# Patient Record
Sex: Male | Born: 1955 | Race: White | Hispanic: No | Marital: Married | Smoking: Never smoker
Health system: Southern US, Community
[De-identification: ages and names within clinical notes are randomized; demographics above are authoritative.]

## PROBLEM LIST (undated history)

## (undated) DIAGNOSIS — S83209A Unspecified tear of unspecified meniscus, current injury, unspecified knee, initial encounter: Secondary | ICD-10-CM

## (undated) HISTORY — PX: MOUTH SURGERY: SHX715

---

## 2014-11-27 ENCOUNTER — Other Ambulatory Visit: Payer: Self-pay | Admitting: Orthopedic Surgery

## 2014-11-27 DIAGNOSIS — M2392 Unspecified internal derangement of left knee: Secondary | ICD-10-CM

## 2014-11-27 DIAGNOSIS — M25562 Pain in left knee: Secondary | ICD-10-CM

## 2014-11-30 ENCOUNTER — Ambulatory Visit
Admission: RE | Admit: 2014-11-30 | Discharge: 2014-11-30 | Disposition: A | Discharge status: Home / Self Care | Payer: BLUE CROSS/BLUE SHIELD | Source: Ambulatory Visit | Attending: Orthopedic Surgery | Admitting: Orthopedic Surgery

## 2014-11-30 DIAGNOSIS — M7122 Synovial cyst of popliteal space [Baker], left knee: Secondary | ICD-10-CM | POA: Diagnosis not present

## 2014-11-30 DIAGNOSIS — M2392 Unspecified internal derangement of left knee: Secondary | ICD-10-CM

## 2014-11-30 DIAGNOSIS — M25562 Pain in left knee: Secondary | ICD-10-CM | POA: Diagnosis present

## 2014-11-30 DIAGNOSIS — X58XXXA Exposure to other specified factors, initial encounter: Secondary | ICD-10-CM | POA: Diagnosis not present

## 2014-11-30 DIAGNOSIS — M9420 Chondromalacia, unspecified site: Secondary | ICD-10-CM | POA: Diagnosis not present

## 2014-11-30 DIAGNOSIS — S83242A Other tear of medial meniscus, current injury, left knee, initial encounter: Secondary | ICD-10-CM | POA: Diagnosis not present

## 2014-12-03 ENCOUNTER — Encounter: Payer: Self-pay | Admitting: *Deleted

## 2014-12-03 ENCOUNTER — Inpatient Hospital Stay: Admission: RE | Admit: 2014-12-03 | Payer: BLUE CROSS/BLUE SHIELD | Source: Ambulatory Visit

## 2014-12-04 ENCOUNTER — Encounter: Payer: Self-pay | Admitting: *Deleted

## 2014-12-04 NOTE — Patient Instructions (Signed)
  Your procedure is scheduled on: 12-12-14 Report to [facility] To find out your arrival time please call [phone] between 1PM - 3PM on 12-11-14  Remember: Instructions that are not followed completely may result in serious medical risk, up to and including death, or upon the discretion of your surgeon and anesthesiologist your surgery may need to be rescheduled.    _X___ 1. Do not eat food or drink liquids after midnight. No gum chewing or hard candies.     _X___ 2. No Alcohol for 24 hours before or after surgery.   ____ 3. Bring all medications with you on the day of surgery if instructed.    ____ 4. Notify your doctor if there is any change in your medical condition     (cold, fever, infections).     Do not wear jewelry, make-up, hairpins, clips or nail polish.  Do not wear lotions, powders, or perfumes. You may wear deodorant.  Do not shave 48 hours prior to surgery. Men may shave face and neck.  Do not bring valuables to the hospital.    [facility] is not responsible for any belongings or valuables.               Contacts, dentures or bridgework may not be worn into surgery.  Leave your suitcase in the car. After surgery it may be brought to your room.  For patients admitted to the hospital, discharge time is determined by your treatment team.   Patients discharged the day of surgery will not be allowed to drive home.   Please read over the following fact sheets that you were given:      ____ Take these medicines the morning of surgery with A SIP OF WATER:    1. NONE  2.   3.   4.  5.  6.  ____ Fleet Enema (as directed)   ____ Use CHG Soap as directed  ____ Use inhalers on the day of surgery  ____ Stop metformin 2 days prior to surgery    ____ Take 1/2 of usual insulin dose the night before surgery and none on the morning of surgery.   ____ Stop Coumadin/Plavix/aspirin-N/A  _X___ Stop Anti-inflammatories-STOP  IBUPROFEN/MELOXICAM 7 DAYS PRIOR   ____ Stop supplements until after surgery.    ____ Bring C-Pap to the hospital.

## 2014-12-12 ENCOUNTER — Encounter: Payer: Self-pay | Admitting: *Deleted

## 2014-12-12 ENCOUNTER — Encounter
Admission: RE | Disposition: A | Discharge status: Home / Self Care | Payer: Self-pay | Source: Ambulatory Visit | Attending: Orthopedic Surgery

## 2014-12-12 ENCOUNTER — Ambulatory Visit
Admission: RE | Admit: 2014-12-12 | Discharge: 2014-12-12 | Disposition: A | Discharge status: Home / Self Care | Payer: BLUE CROSS/BLUE SHIELD | Source: Ambulatory Visit | Attending: Orthopedic Surgery | Admitting: Orthopedic Surgery

## 2014-12-12 ENCOUNTER — Ambulatory Visit: Payer: BLUE CROSS/BLUE SHIELD | Admitting: Anesthesiology

## 2014-12-12 DIAGNOSIS — M23222 Derangement of posterior horn of medial meniscus due to old tear or injury, left knee: Secondary | ICD-10-CM | POA: Diagnosis not present

## 2014-12-12 DIAGNOSIS — M94262 Chondromalacia, left knee: Secondary | ICD-10-CM | POA: Diagnosis present

## 2014-12-12 DIAGNOSIS — Z79899 Other long term (current) drug therapy: Secondary | ICD-10-CM | POA: Insufficient documentation

## 2014-12-12 HISTORY — PX: KNEE ARTHROSCOPY: SHX127

## 2014-12-12 HISTORY — DX: Unspecified tear of unspecified meniscus, current injury, unspecified knee, initial encounter: S83.209A

## 2014-12-12 SURGERY — ARTHROSCOPY, KNEE
Anesthesia: General | Laterality: Left

## 2014-12-12 MED ORDER — BUPIVACAINE-EPINEPHRINE (PF) 0.25% -1:200000 IJ SOLN
INTRAMUSCULAR | Status: AC
  Filled 2014-12-12: qty 30

## 2014-12-12 MED ORDER — PROPOFOL 10 MG/ML IV BOLUS
INTRAVENOUS | Status: DC | PRN
  Administered 2014-12-12: 170 mg via INTRAVENOUS
  Administered 2014-12-12: 30 mg via INTRAVENOUS

## 2014-12-12 MED ORDER — FENTANYL CITRATE (PF) 100 MCG/2ML IJ SOLN
25.0000 ug | INTRAMUSCULAR | Status: DC | PRN
  Administered 2014-12-12: 25 ug via INTRAVENOUS

## 2014-12-12 MED ORDER — FAMOTIDINE 20 MG PO TABS
ORAL_TABLET | ORAL | Status: AC
  Administered 2014-12-12: 20 mg via ORAL
  Filled 2014-12-12: qty 1

## 2014-12-12 MED ORDER — FENTANYL CITRATE (PF) 100 MCG/2ML IJ SOLN
INTRAMUSCULAR | Status: DC | PRN
  Administered 2014-12-12 (×8): 25 ug via INTRAVENOUS

## 2014-12-12 MED ORDER — MORPHINE SULFATE (PF) 4 MG/ML IV SOLN
INTRAVENOUS | Status: AC
  Filled 2014-12-12: qty 1

## 2014-12-12 MED ORDER — ONDANSETRON HCL 4 MG/2ML IJ SOLN: 4.0000 mg | Freq: Once | INTRAMUSCULAR | Status: DC | PRN

## 2014-12-12 MED ORDER — FAMOTIDINE 20 MG PO TABS
20.0000 mg | ORAL_TABLET | Freq: Once | ORAL | Status: AC
  Administered 2014-12-12: 20 mg via ORAL

## 2014-12-12 MED ORDER — HYDROCODONE-ACETAMINOPHEN 5-325 MG PO TABS: 1.0000 | ORAL_TABLET | ORAL | Status: AC | PRN

## 2014-12-12 MED ORDER — BUPIVACAINE-EPINEPHRINE (PF) 0.25% -1:200000 IJ SOLN
INTRAMUSCULAR | Status: DC | PRN
  Administered 2014-12-12: 30 mL

## 2014-12-12 MED ORDER — LIDOCAINE HCL (CARDIAC) 20 MG/ML IV SOLN
INTRAVENOUS | Status: DC | PRN
  Administered 2014-12-12: 60 mg via INTRAVENOUS

## 2014-12-12 MED ORDER — CHLORHEXIDINE GLUCONATE 4 % EX LIQD: Freq: Once | CUTANEOUS | Status: DC

## 2014-12-12 MED ORDER — ACETAMINOPHEN 10 MG/ML IV SOLN
INTRAVENOUS | Status: DC | PRN
  Administered 2014-12-12: 1000 mg via INTRAVENOUS

## 2014-12-12 MED ORDER — MIDAZOLAM HCL 5 MG/5ML IJ SOLN
INTRAMUSCULAR | Status: DC | PRN
  Administered 2014-12-12: 2 mg via INTRAVENOUS

## 2014-12-12 MED ORDER — FENTANYL CITRATE (PF) 100 MCG/2ML IJ SOLN
INTRAMUSCULAR | Status: AC
  Administered 2014-12-12: 25 ug via INTRAVENOUS
  Filled 2014-12-12: qty 2

## 2014-12-12 MED ORDER — ACETAMINOPHEN 10 MG/ML IV SOLN
INTRAVENOUS | Status: AC
  Filled 2014-12-12: qty 100

## 2014-12-12 MED ORDER — LACTATED RINGERS IV SOLN
INTRAVENOUS | Status: DC
Start: 2014-12-12 — End: 2014-12-12
  Administered 2014-12-12 (×2): via INTRAVENOUS

## 2014-12-12 MED ORDER — DEXAMETHASONE SODIUM PHOSPHATE 10 MG/ML IJ SOLN
INTRAMUSCULAR | Status: DC | PRN
  Administered 2014-12-12: 5 mg via INTRAVENOUS

## 2014-12-12 MED ORDER — EPHEDRINE SULFATE 50 MG/ML IJ SOLN
INTRAMUSCULAR | Status: DC | PRN
  Administered 2014-12-12: 5 mg via INTRAVENOUS

## 2014-12-12 SURGICAL SUPPLY — 23 items
BLADE SHAVER 4.5 DBL SERAT CV (CUTTER) ×3 IMPLANT
BNDG ESMARK 6X12 TAN STRL LF (GAUZE/BANDAGES/DRESSINGS) ×3 IMPLANT
DRSG DERMACEA 8X12 NADH (GAUZE/BANDAGES/DRESSINGS) ×3 IMPLANT
DURAPREP 26ML APPLICATOR (WOUND CARE) ×6 IMPLANT
GAUZE SPONGE 4X4 12PLY STRL (GAUZE/BANDAGES/DRESSINGS) ×3 IMPLANT
GLOVE BIOGEL M STRL SZ7.5 (GLOVE) ×3 IMPLANT
GLOVE INDICATOR 8.0 STRL GRN (GLOVE) ×3 IMPLANT
GOWN STRL REUS W/ TWL LRG LVL3 (GOWN DISPOSABLE) ×1 IMPLANT
GOWN STRL REUS W/ TWL LRG LVL4 (GOWN DISPOSABLE) ×1 IMPLANT
GOWN STRL REUS W/TWL LRG LVL3 (GOWN DISPOSABLE) ×2
GOWN STRL REUS W/TWL LRG LVL4 (GOWN DISPOSABLE) ×2
IV LACTATED RINGER IRRG 3000ML (IV SOLUTION) ×12
IV LR IRRIG 3000ML ARTHROMATIC (IV SOLUTION) ×6 IMPLANT
MANIFOLD NEPTUNE II (INSTRUMENTS) ×3 IMPLANT
PACK ARTHROSCOPY KNEE (MISCELLANEOUS) ×3 IMPLANT
SET TUBE SUCT SHAVER OUTFL 24K (TUBING) ×3 IMPLANT
SET TUBE TIP INTRA-ARTICULAR (MISCELLANEOUS) ×3 IMPLANT
STRAP SAFETY BODY (MISCELLANEOUS) ×3 IMPLANT
SUT ETHILON 3-0 FS-10 30 BLK (SUTURE) ×3
SUTURE EHLN 3-0 FS-10 30 BLK (SUTURE) ×1 IMPLANT
TUBING ARTHRO INFLOW-ONLY STRL (TUBING) ×3 IMPLANT
WAND HAND CNTRL MULTIVAC 50 (MISCELLANEOUS) ×3 IMPLANT
WRAP KNEE W/COLD PACKS 25.5X14 (SOFTGOODS) ×3 IMPLANT

## 2014-12-12 NOTE — H&P (Signed)
The patient has been re-examined, and the chart reviewed, and there have been no interval changes to the documented history and physical.    The risks, benefits, and alternatives have been discussed at length. The patient expressed understanding of the risks benefits and agreed with plans for surgical intervention.  Savalas Monje P. Breean Nannini, Jr. M.D.    

## 2014-12-12 NOTE — Anesthesia Postprocedure Evaluation (Signed)
  Anesthesia Post-op Note  Patient: Roberto Mathews  Procedure(s) Performed: Procedure(s): Right knee arthroscopy, partial medial menisectomy (Left)  Anesthesia type:General  Patient location: PACU  Post pain: Pain level controlled  Post assessment: Post-op Vital signs reviewed, Patient's Cardiovascular Status Stable, Respiratory Function Stable, Patent Airway and No signs of Nausea or vomiting  Post vital signs: Reviewed and stable  Last Vitals:  Filed Vitals:   12/12/14 1907  BP:   Pulse:   Temp: 36.6 C  Resp:     Level of consciousness: awake, alert  and patient cooperative  Complications: No apparent anesthesia complications

## 2014-12-12 NOTE — Op Note (Signed)
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   ASSISTANT: none  ANESTHESIA: general  ESTIMATED BLOOD LOSS: Minimal  FLUIDS REPLACED: 1000 mL of crystalloid  TOURNIQUET TIME: Not used   DRAINS: none  IMPLANTS UTILIZED: None  INDICATIONS FOR SURGERY: Roberto Mathews is a 59 y.o. year old male who has been seen for complaints of left knee pain. MRI demonstrated findings consistent with meniscal pathology. After discussion of the risks and benefits of surgical intervention, the patient expressed understanding of the risks benefits and agree with plans for left knee arthroscopy.   PROCEDURE IN DETAIL: The patient was brought into the operating room and, after adequate general anesthesia was achieved, a tourniquet was applied to the left thigh and the leg was placed in the leg holder. All bony prominences were well padded. The patient's left knee was cleaned and prepped with alcohol and Duraprep and draped in the usual sterile fashion. A "timeout" was performed as per usual protocol. The anticipated portal sites were injected with 0.25% Marcaine with epinephrine. An anterolateral incision was made and a cannula was inserted. A small effusion was evacuated and the knee was distended with fluid using the pump. The scope was advanced down the medial gutter into the medial compartment. Under visualization with the scope, an anteromedial portal was created and a hooked probe was inserted. The medial meniscus was visualized and probed. There was a complex tear of the  posterior horn of the medial meniscus. The tear was debrided using meniscal punches and a 4.5 mm shaver. Final contouring was performed using the 50 ArthroCare wand. The articular cartilage was visualized. Several free floating chondral fragments were noted and removed using the incisor shaver. There was an area of grade 3 chondromalacia involving the medial femoral condyle. This area was debrided and contoured using the ArthroCare wand.  The scope was then advanced into the intercondylar notch. The anterior cruciate ligament was visualized and probed and felt to be intact. The scope was removed from the lateral portal and reinserted via the anteromedial portal to better visualize the lateral compartment. The lateral meniscus was visualized and probed. The lateral meniscus was stable without evidence of tear. The articular cartilage of the lateral compartment was visualized. The articular cartilage was in good condition. Finally, the scope was advanced so as to visualize the patellofemoral articulation. Good patellar tracking was appreciated. There was an area of grade 3 chondromalacia involving the trochlear groove. This tear was debrided and contoured using the ArthroCare wand.  The knee was irrigated with copius amounts of fluid and suctioned dry. The anterolateral portal was re-approximated with #3-0 nylon. A combination of 0.25% Marcaine with epinephrine and 4 mg of Morphine were injected via the scope. The scope was removed and the anteromedial portal was re-approximated with #3-0 nylon. A sterile dressing was applied followed by application of an ice wrap.  The patient tolerated the procedure well and was transported to the PACU in stable condition.  James P. Holley Bouche., M.D.

## 2014-12-12 NOTE — Brief Op Note (Signed)
12/12/2014  6:31 PM  PATIENT:  Bartholome Bill  59 y.o. male  PRE-OPERATIVE DIAGNOSIS:  INTERNAL DERANGEMENT left knee  POST-OPERATIVE DIAGNOSIS:  medial meniscal tear, grade 3-4 chondromalacia - left knee  PROCEDURE:  Procedure(s): Right knee arthroscopy, partial medial menisectomy (Left), chondroplasty  SURGEON:  Surgeon(s) and Role:    * Dereck Leep, MD - Primary  ASSISTANTS: none   ANESTHESIA:   general  EBL:  Total I/O In: 1000 [I.V.:1000] Out: - min  BLOOD ADMINISTERED:none  DRAINS: none   LOCAL MEDICATIONS USED:  MARCAINE     SPECIMEN:  No Specimen  DISPOSITION OF SPECIMEN:  N/A  COUNTS:  YES  TOURNIQUET: Not used  DICTATION: .Dragon Dictation  PLAN OF CARE: Discharge to home after PACU  PATIENT DISPOSITION:  PACU - hemodynamically stable.   Delay start of Pharmacological VTE agent (>24hrs) due to surgical blood loss or risk of bleeding: not applicable

## 2014-12-12 NOTE — Anesthesia Preprocedure Evaluation (Signed)
Anesthesia Evaluation  Patient identified by MRN, date of birth, ID band Patient awake    Reviewed: Allergy & Precautions, NPO status , Patient's Chart, lab work & pertinent test results  History of Anesthesia Complications Negative for: history of anesthetic complications  Airway Mallampati: I       Dental  (+) Implants   Pulmonary neg pulmonary ROS,           Cardiovascular negative cardio ROS       Neuro/Psych    GI/Hepatic negative GI ROS, Neg liver ROS,   Endo/Other  negative endocrine ROS  Renal/GU negative Renal ROS     Musculoskeletal   Abdominal   Peds  Hematology negative hematology ROS (+)   Anesthesia Other Findings   Reproductive/Obstetrics                             Anesthesia Physical Anesthesia Plan  ASA: II  Anesthesia Plan: General   Post-op Pain Management:    Induction: Intravenous  Airway Management Planned: LMA  Additional Equipment:   Intra-op Plan:   Post-operative Plan:   Informed Consent: I have reviewed the patients History and Physical, chart, labs and discussed the procedure including the risks, benefits and alternatives for the proposed anesthesia with the patient or authorized representative who has indicated his/her understanding and acceptance.     Plan Discussed with:   Anesthesia Plan Comments:         Anesthesia Quick Evaluation

## 2014-12-12 NOTE — Anesthesia Procedure Notes (Signed)
Procedure Name: LMA Insertion Date/Time: 12/12/2014 4:53 PM Performed by: Dionne Bucy Pre-anesthesia Checklist: Patient identified, Patient being monitored, Timeout performed, Emergency Drugs available and Suction available Patient Re-evaluated:Patient Re-evaluated prior to inductionOxygen Delivery Method: Circle system utilized Preoxygenation: Pre-oxygenation with 100% oxygen Intubation Type: IV induction Ventilation: Mask ventilation without difficulty LMA: LMA inserted LMA Size: 5.0 Tube type: Oral Number of attempts: 1 Placement Confirmation: positive ETCO2 Tube secured with: Tape Dental Injury: Teeth and Oropharynx as per pre-operative assessment

## 2014-12-12 NOTE — Transfer of Care (Signed)
Immediate Anesthesia Transfer of Care Note  Patient: Roberto Mathews  Procedure(s) Performed: Procedure(s): Right knee arthroscopy, partial medial menisectomy (Left)  Patient Location: PACU  Anesthesia Type:General  Level of Consciousness: awake, alert , oriented and patient cooperative  Airway & Oxygen Therapy: Patient Spontanous Breathing and Patient connected to face mask oxygen  Post-op Assessment: Report given to RN and Post -op Vital signs reviewed and stable  Post vital signs: Reviewed and stable  Last Vitals:  Filed Vitals:   12/12/14 1327  BP: 172/98  Pulse: 83  Temp: 36.8 C  Resp: 16    Complications: No apparent anesthesia complications

## 2014-12-12 NOTE — Discharge Instructions (Signed)
°  Instructions after Knee Arthroscopy  ° ° Arther Heisler P. Adelis Docter, Jr., M.D.    ° Dept. of Orthopaedics & Sports Medicine ° [facility] ° [address] ° [place], [place]  [address] ° ° Phone: [phone]   Fax: [phone] ° ° °DIET: °• Drink plenty of non-alcoholic fluids & begin a light diet. °• Resume your normal diet the day after surgery. ° °ACTIVITY:  °• You may use crutches or a walker with weight-bearing as tolerated, unless instructed otherwise. °• You may wean yourself off of the walker or crutches as tolerated.  °• Begin doing gentle exercises. Exercising will reduce the pain and swelling, increase motion, and prevent muscle weakness.   °• Avoid strenuous activities or athletics for a minimum of 4-6 weeks after arthroscopic surgery. °• Do not drive or operate any equipment until instructed. ° °WOUND CARE:  °• Place one to two pillows under the knee the first day or two when sitting or lying.  °• Continue to use the ice packs periodically to reduce pain and swelling. °• The small incisions in your knee are closed with nylon stitches. The stitches will be removed in the office. °• The bulky dressing may be removed on the second day after surgery. DO NOT TOUCH THE STITCHES. Put a Band-Aid over each stitch. Do NOT use any ointments or creams on the incisions.  °• You may bathe or shower after the stitches are removed at the first office visit following surgery. ° °MEDICATIONS: °• You may resume your regular medications. °• Please take the pain medication as prescribed. °• Do not take pain medication on an empty stomach. °• Do not drive or drink alcoholic beverages when taking pain medications. ° °CALL THE OFFICE FOR: °• Temperature above 101 degrees °• Excessive bleeding or drainage on the dressing. °• Excessive swelling, coldness, or paleness of the toes. °• Persistent nausea and vomiting. ° °FOLLOW-UP:  °• You should have an appointment to return to the office in 7-10 days after surgery.  °  °

## 2014-12-13 ENCOUNTER — Encounter: Payer: Self-pay | Admitting: Orthopedic Surgery

## 2014-12-24 NOTE — Addendum Note (Signed)
Addendum  created 12/24/14 1122 by Dionne Bucy, CRNA   Modules edited: Anesthesia Responsible Staff

## 2016-12-05 IMAGING — MR MR KNEE*L* W/O CM
5 series · 34 of 40 positions shown · non-contrast
Comparison: None.

CLINICAL DATA: Left knee pain and swelling for 2 months.

EXAM:
MRI OF THE LEFT KNEE WITHOUT CONTRAST
TECHNIQUE: Multiplanar, multisequence MR imaging of the knee was performed. No
intravenous contrast was administered.

[Series 5: PD fat-sat · axial · 3.0mm · 0.50mm/px · z∈[-66,+50]mm · 8 of 36 slices shown (1 of 3)]
[im 1/36]
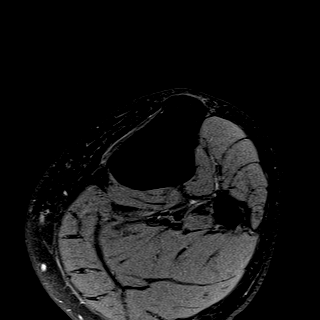
[im 6/36]
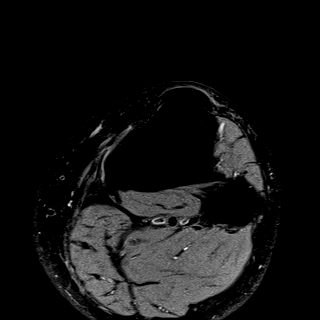
[im 11/36]
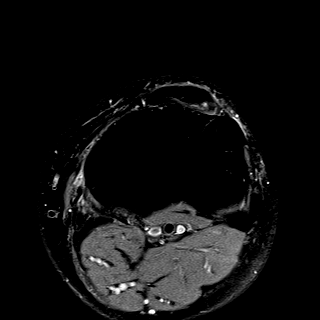
[im 16/36]
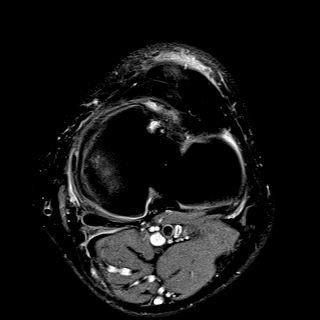
[im 21/36]
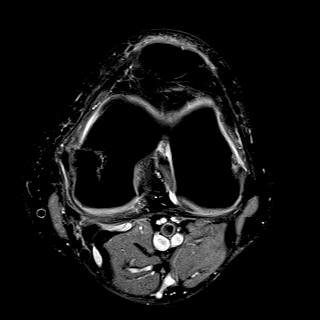
[im 26/36]
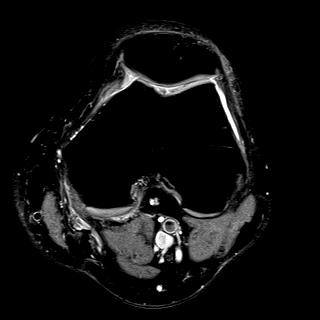
[im 31/36]
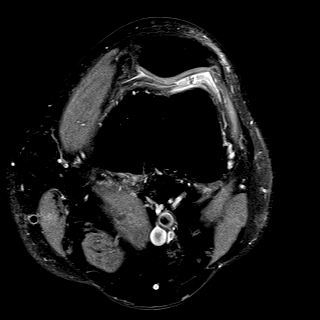
[im 36/36]
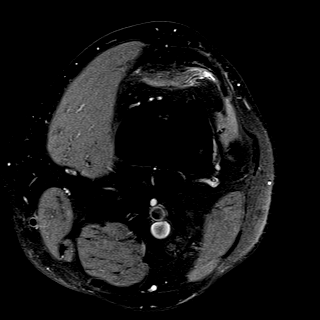

[Series 6: T1 · coronal · 3.0mm · 0.50mm/px · 2 of 33 slices shown]
[im 1/33]
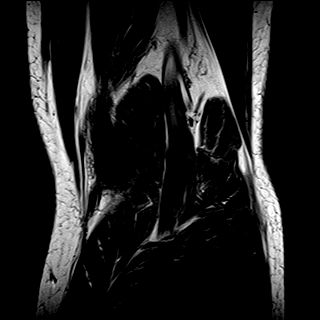
[im 5/33]
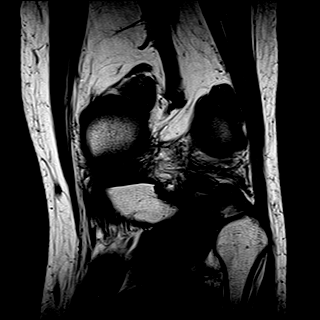

[Series 7: T2 fat-sat · coronal · 3.0mm · 0.31mm/px · 8 of 33 slices shown]
[im 1/33]
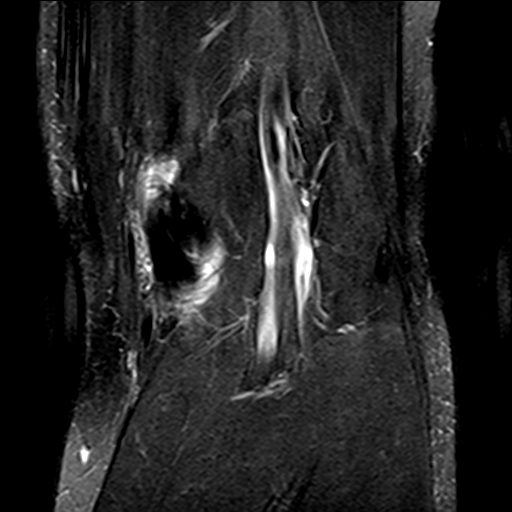
[im 5/33]
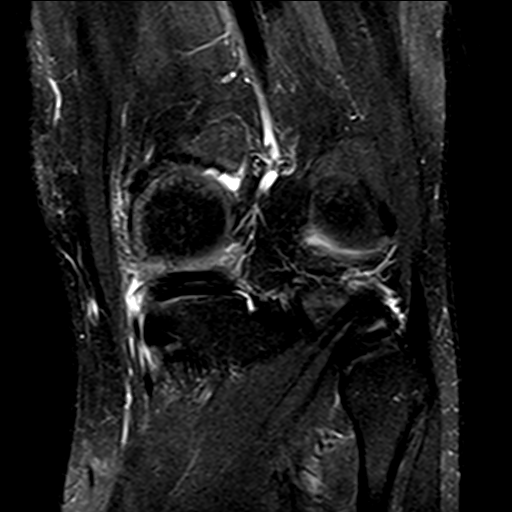
[im 10/33]
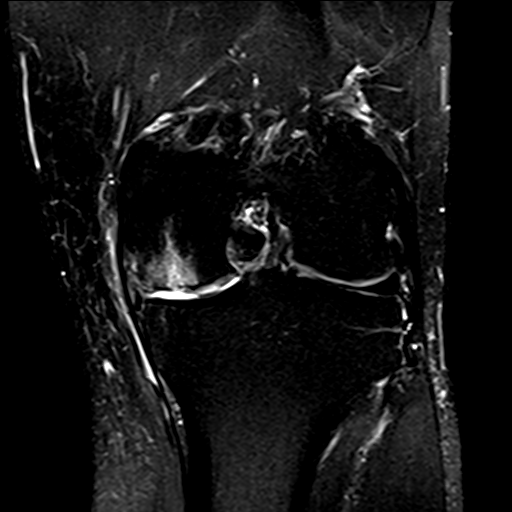
[im 14/33]
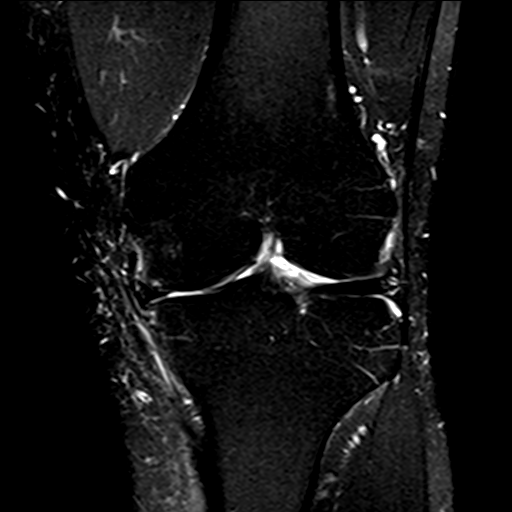
[im 19/33]
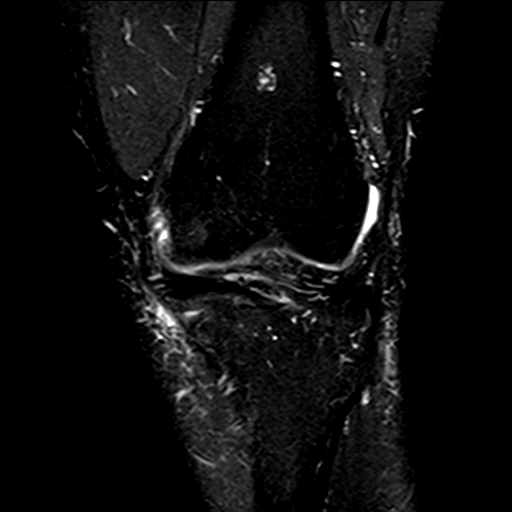
[im 23/33]
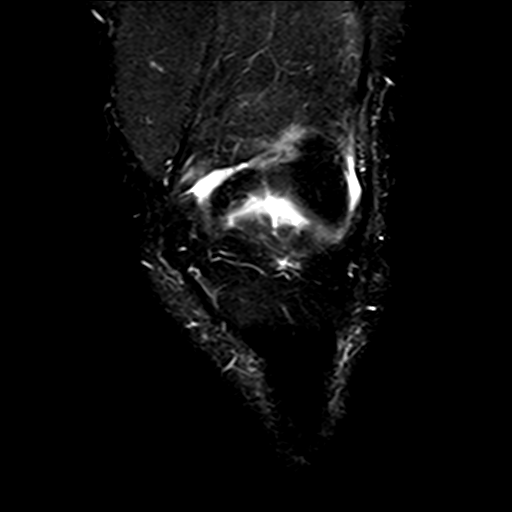
[im 28/33]
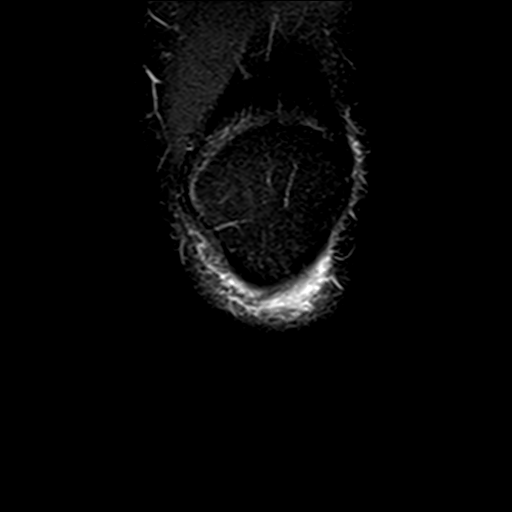
[im 33/33]
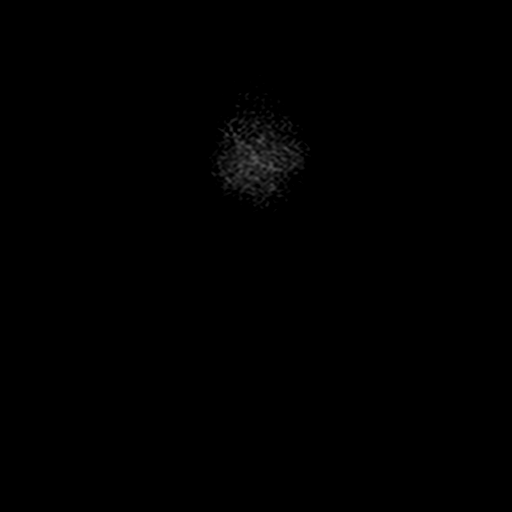

[Series 8: PD fat-sat · sagittal · 3.0mm · 0.50mm/px · 8 of 36 slices shown (2 of 3)]
[im 1/36]
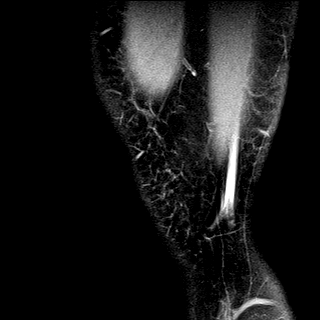
[im 6/36]
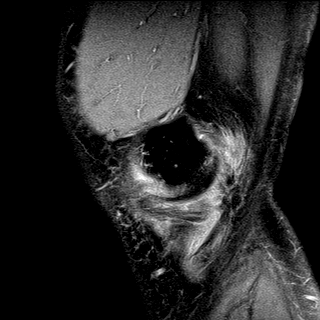
[im 11/36]
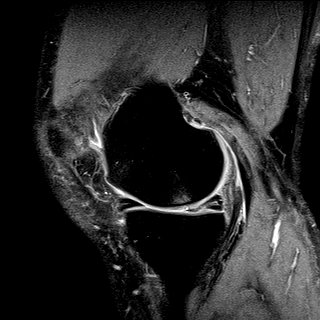
[im 16/36]
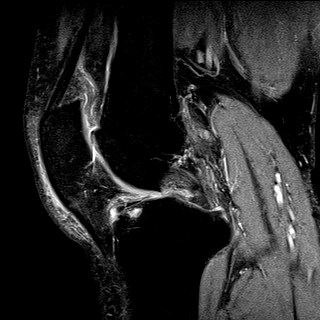
[im 21/36]
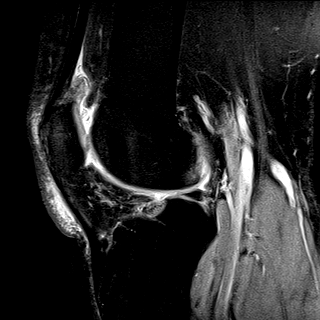
[im 26/36]
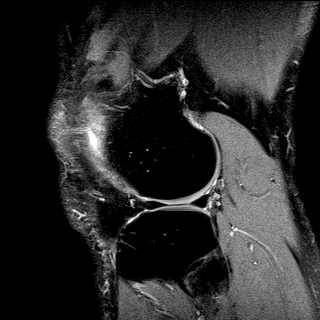
[im 31/36]
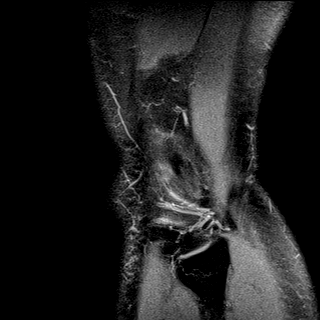
[im 36/36]
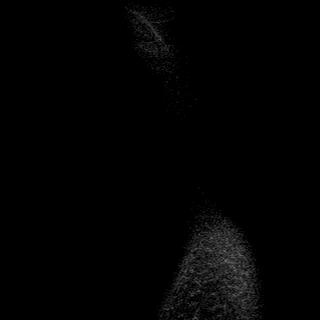

[Series 9: PD fat-sat · coronal · 3.0mm · 0.50mm/px · 8 of 33 slices shown (3 of 3)]
[im 1/33]
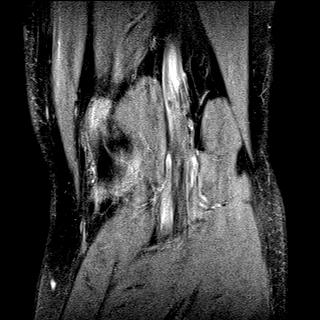
[im 5/33]
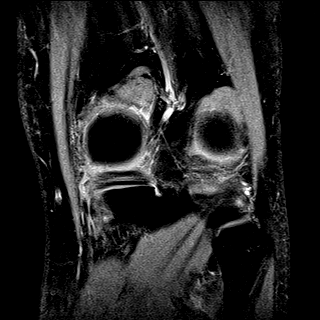
[im 10/33]
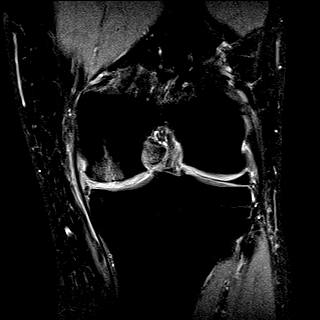
[im 14/33]
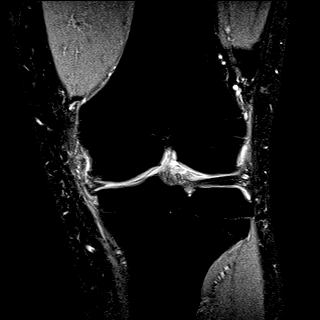
[im 19/33]
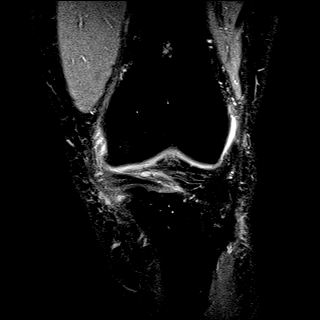
[im 23/33]
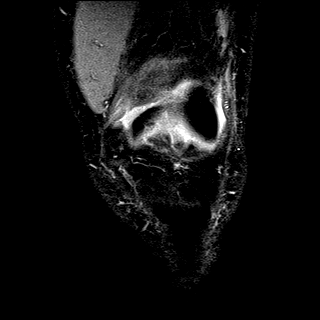
[im 28/33]
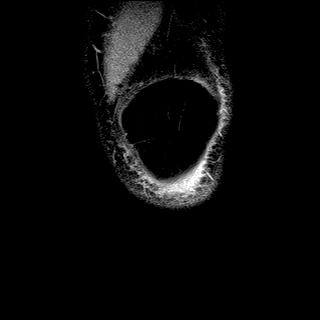
[im 33/33]
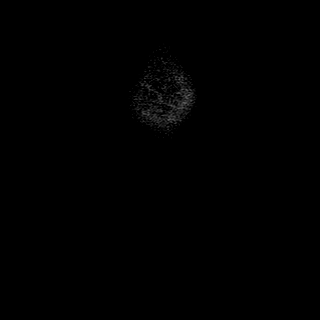

[34 of 40 positions shown; findings below may reference images not displayed]

FINDINGS: MENISCI

Medial meniscus: There is a complex tear involving the midbody and
posterior horn. There is an almost complete avulsion of the midbody.
I think that the avulsed component is flipped into the superior
gutter. There is also a irregular horizontal tear of the
undersurface of the posterior horn.

Lateral meniscus:  Normal.

LIGAMENTS

Cruciates:  Normal.

Collaterals:  Normal.

CARTILAGE

Patellofemoral: Small focal areas of grade 4 chondromalacia of the
medial facet of the patella and of the medial aspect of the
trochlear groove of the distal femur.

Medial: Extensive grade 4 chondromalacia with subcortical edema in
the central portion of the tibial plateau and femoral condyle with
multiple areas of full-thickness cartilage loss. Medial joint space
narrowing.

Lateral:  Normal.

Joint:  No joint effusion.

Popliteal Fossa:  Baker's cyst.

Extensor Mechanism:  Normal.

Bones:  Osteoarthritic changes in the medial compartment.
IMPRESSION: 1. Complex tear of the midbody and posterior horn of the medial
meniscus with secondary arthritic changes of the medial compartment.
2. Focal areas of chondromalacia of the patellofemoral compartment.

## 2017-08-11 ENCOUNTER — Other Ambulatory Visit: Payer: Self-pay | Admitting: Internal Medicine

## 2017-09-01 ENCOUNTER — Other Ambulatory Visit: Payer: Self-pay | Admitting: Internal Medicine

## 2017-09-01 DIAGNOSIS — I10 Essential (primary) hypertension: Secondary | ICD-10-CM

## 2017-09-01 DIAGNOSIS — E785 Hyperlipidemia, unspecified: Secondary | ICD-10-CM

## 2017-09-06 ENCOUNTER — Ambulatory Visit
Admission: RE | Admit: 2017-09-06 | Discharge: 2017-09-06 | Disposition: A | Discharge status: Home / Self Care | Payer: BLUE CROSS/BLUE SHIELD | Source: Ambulatory Visit | Attending: Internal Medicine | Admitting: Internal Medicine

## 2017-09-08 ENCOUNTER — Ambulatory Visit
Admission: RE | Admit: 2017-09-08 | Discharge: 2017-09-08 | Disposition: A | Discharge status: Home / Self Care | Payer: BLUE CROSS/BLUE SHIELD | Source: Ambulatory Visit | Attending: Internal Medicine | Admitting: Internal Medicine

## 2017-09-08 DIAGNOSIS — E785 Hyperlipidemia, unspecified: Secondary | ICD-10-CM | POA: Diagnosis present

## 2017-09-08 DIAGNOSIS — Z8249 Family history of ischemic heart disease and other diseases of the circulatory system: Secondary | ICD-10-CM | POA: Diagnosis not present

## 2017-09-08 DIAGNOSIS — I10 Essential (primary) hypertension: Secondary | ICD-10-CM | POA: Diagnosis present

## 2017-09-08 DIAGNOSIS — I77819 Aortic ectasia, unspecified site: Secondary | ICD-10-CM | POA: Insufficient documentation

## 2017-10-27 DIAGNOSIS — C4491 Basal cell carcinoma of skin, unspecified: Secondary | ICD-10-CM

## 2017-10-27 HISTORY — DX: Basal cell carcinoma of skin, unspecified: C44.91

## 2019-02-08 DIAGNOSIS — D239 Other benign neoplasm of skin, unspecified: Secondary | ICD-10-CM

## 2019-02-08 HISTORY — DX: Other benign neoplasm of skin, unspecified: D23.9

## 2020-02-14 ENCOUNTER — Encounter: Payer: 59 | Admitting: Dermatology

## 2020-02-14 ENCOUNTER — Other Ambulatory Visit: Payer: Self-pay

## 2020-10-16 ENCOUNTER — Other Ambulatory Visit: Payer: Self-pay | Admitting: Internal Medicine

## 2020-10-16 DIAGNOSIS — I77811 Abdominal aortic ectasia: Secondary | ICD-10-CM

## 2020-11-05 ENCOUNTER — Ambulatory Visit: Payer: 59

## 2023-03-11 ENCOUNTER — Other Ambulatory Visit: Payer: Self-pay | Admitting: Internal Medicine

## 2023-03-11 DIAGNOSIS — Q254 Congenital malformation of aorta unspecified: Secondary | ICD-10-CM
